# Patient Record
Sex: Male | Born: 1960 | Race: White | Hispanic: No | Marital: Single | Smoking: Never smoker
Health system: Southern US, Community
[De-identification: ages and names within clinical notes are randomized; demographics above are authoritative.]

## PROBLEM LIST (undated history)

## (undated) HISTORY — PX: MITRAL VALVE SURGERY: SHX714

---

## 2020-03-09 ENCOUNTER — Emergency Department (HOSPITAL_COMMUNITY): Payer: BC Managed Care – PPO

## 2020-03-09 ENCOUNTER — Encounter (HOSPITAL_COMMUNITY): Payer: Self-pay | Admitting: Emergency Medicine

## 2020-03-09 ENCOUNTER — Emergency Department (HOSPITAL_COMMUNITY)
Admission: EM | Admit: 2020-03-09 | Discharge: 2020-03-09 | Disposition: A | Discharge status: Home / Self Care | Payer: BC Managed Care – PPO | Attending: Emergency Medicine | Admitting: Emergency Medicine

## 2020-03-09 ENCOUNTER — Telehealth (HOSPITAL_COMMUNITY): Payer: Self-pay

## 2020-03-09 ENCOUNTER — Other Ambulatory Visit: Payer: Self-pay

## 2020-03-09 DIAGNOSIS — R0789 Other chest pain: Secondary | ICD-10-CM | POA: Insufficient documentation

## 2020-03-09 DIAGNOSIS — Z952 Presence of prosthetic heart valve: Secondary | ICD-10-CM | POA: Insufficient documentation

## 2020-03-09 LAB — CBC
HCT: 42 % (ref 39.0–52.0)
Hemoglobin: 14.1 g/dL (ref 13.0–17.0)
MCH: 29.4 pg (ref 26.0–34.0)
MCHC: 33.6 g/dL (ref 30.0–36.0)
MCV: 87.7 fL (ref 80.0–100.0)
Platelets: 185 10*3/uL (ref 150–400)
RBC: 4.79 MIL/uL (ref 4.22–5.81)
RDW: 12.1 % (ref 11.5–15.5)
WBC: 9.3 10*3/uL (ref 4.0–10.5)
nRBC: 0 % (ref 0.0–0.2)

## 2020-03-09 LAB — BASIC METABOLIC PANEL
Anion gap: 8 (ref 5–15)
BUN: 21 mg/dL — ABNORMAL HIGH (ref 6–20)
CO2: 27 mmol/L (ref 22–32)
Calcium: 9.2 mg/dL (ref 8.9–10.3)
Chloride: 102 mmol/L (ref 98–111)
Creatinine, Ser: 1.22 mg/dL (ref 0.61–1.24)
GFR calc Af Amer: >60 mL/min (ref >60)
GFR calc non Af Amer: >60 mL/min (ref >60)
Glucose, Bld: 126 mg/dL — ABNORMAL HIGH (ref 70–99)
Potassium: 4 mmol/L (ref 3.5–5.1)
Sodium: 137 mmol/L (ref 135–145)

## 2020-03-09 LAB — TROPONIN I (HIGH SENSITIVITY)
Troponin I (High Sensitivity): <2 ng/L (ref <18)
Troponin I (High Sensitivity): <2 ng/L (ref <18)

## 2020-03-09 MED ORDER — SODIUM CHLORIDE 0.9% FLUSH: 3.0000 mL | Freq: Once | INTRAVENOUS | Status: DC

## 2020-03-09 NOTE — ED Triage Notes (Addendum)
Pt sent from UC for CP. States he woke w/L choulder pain, radiated to central chest, causing pressure w/difficulty taking breaths. Normal EKG per UC, sent for persistent cp. Denies any n/v, belching a lot in triage

## 2020-03-09 NOTE — Telephone Encounter (Signed)
Patient presents to UC for left side chest pain, chest heaviness, mild SOB, shakiness, and a "bad feeling." Patient reports he has a hx of mitral valva surgery about 5 year ago. STAT ECG completed and showed to Moshe Cipro, NP and Dr. Mardella Layman, MD who agrees patient requires further work up than what UC can offer. Discussed with patient, who agreed to plan of care.   Report called to [facility] charge RN, Verlon Au. Vitals are as follows:   BP: 123/83 HR: 65 RR: 16 O2: 100% Temp: 97.30F

## 2020-03-09 NOTE — ED Triage Notes (Signed)
Hx of mitral valve surgery 2015

## 2020-03-09 NOTE — Discharge Instructions (Addendum)
Start taking pepcid, available over the counter twice daily for the next week.  Get rechecked immediately if you develop any new or concerning symptoms.

## 2020-03-09 NOTE — ED Provider Notes (Signed)
[facility] EMERGENCY DEPARTMENT Provider Note   CSN: [id number] Arrival date & time: 03/09/20  3220     History Chief Complaint  Patient presents with  . Chest Pain    Ian Peterson is a 59 y.o. male.  The history is provided by the patient and medical records. No language interpreter was used.  Chest Pain  Ian Peterson is a 59 y.o. male who presents to the Emergency Department complaining of chest pain. He presents the emergency department complaining of left sided in central chest pain. Pain is described as a pressure sensation. Initially started in his left shoulder and was worse with movement and then later resolved in his shoulder and stayed in his central chest. He does have some slight increased discomfort with deep breaths. He has been belching, which seems to improve his symptoms. He denies any fevers, cough, nausea, vomiting, diaphoresis, abdominal pain, leg swelling or pain. 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 No personal or family history of DVT or coronary artery disease. He does not use tobacco or drugs.    History reviewed. No pertinent past medical history.  There are no problems to display for this patient.   Past Surgical History:  Procedure Laterality Date  . MITRAL VALVE SURGERY         No family history on file.  Social History   Tobacco Use  . Smoking status: Never Smoker  . Smokeless tobacco: Never Used  Substance Use Topics  . Alcohol use: Yes    Comment: occasional  . Drug use: Never    Home Medications Prior to Admission medications   Not on File    Allergies    Patient has no allergy information on record.  Review of Systems   Review of Systems  Cardiovascular: Positive for chest pain.  All other systems reviewed and are negative.   Physical Exam Updated Vital Signs BP 116/80 (BP Location: Right Arm)   Pulse 67   Temp 97.6 F (36.4 C) (Oral)   Resp 16   Wt  73.5 kg   SpO2 100%   Physical Exam Vitals and nursing note reviewed.  Constitutional:      Appearance: He is well-developed.  HENT:     Head: Normocephalic and atraumatic.  Cardiovascular:     Rate and Rhythm: Normal rate and regular rhythm.     Heart sounds: No murmur.  Pulmonary:     Effort: Pulmonary effort is normal. No respiratory distress.     Breath sounds: Normal breath sounds.  Abdominal:     Palpations: Abdomen is soft.     Tenderness: There is no abdominal tenderness. There is no guarding or rebound.  Musculoskeletal:        General: No swelling or tenderness.  Skin:    General: Skin is warm and dry.  Neurological:     Mental Status: He is alert and oriented to person, place, and time.  Psychiatric:        Behavior: Behavior normal.     ED Results / Procedures / Treatments   Labs (all labs ordered are listed, but only abnormal results are displayed) Labs Reviewed  BASIC METABOLIC PANEL - Abnormal; Notable for the following components:      Result Value   Glucose, Bld 126 (*)    BUN 21 (*)    All other components within normal limits  CBC  TROPONIN I (HIGH SENSITIVITY)  TROPONIN I (HIGH SENSITIVITY)    EKG EKG Interpretation  Date/Time:  Monday March 09 2020 08:37:34 EDT Ventricular Rate:  67 PR Interval:  182 QRS Duration: 80 QT Interval:  380 QTC Calculation: 401 R Axis:     Text Interpretation: Normal sinus rhythm Normal ECG Confirmed by Pattricia Boss [phone]) on 03/09/2020 12:30:07 PM   Radiology DG Chest 2 View  Result Date: 03/09/2020 CLINICAL DATA:  Chest pain EXAM: CHEST - 2 VIEW COMPARISON:  None. FINDINGS: Lungs are clear. Heart size and pulmonary vascularity are normal. No adenopathy. Patient is status post mitral valve replacement. No pneumothorax. No bone lesions. IMPRESSION: Status post mitral valve replacement. Lungs clear. Cardiac silhouette within normal limits. Electronically Signed   By: Lowella Grip III M.D.   On: 03/09/2020  09:00    Procedures Procedures (including critical care time)  Medications Ordered in ED Medications  sodium chloride flush (NS) 0.9 % injection 3 mL (has no administration in time range)    ED Course  I have reviewed the triage vital signs and the nursing notes.  Pertinent labs & imaging results that were available during my care of the patient were reviewed by me and considered in my medical decision making (see chart for details).    MDM Rules/Calculators/A&P                     Patient here for evaluation of chest discomfort, shoulder discomfort since this morning. EKG without acute ischemic changes and troponin is negative times two. He is low risk for acute cardiac event. Presentation is not consistent with ACS, PE, dissection. Discussed with patient unclear source of symptoms, question possible reflux contributing to his symptoms with the element of belching. Discussed home care, outpatient follow-up and return precautions.  Final Clinical Impression(s) / ED Diagnoses Final diagnoses:  Atypical chest pain    Rx / DC Orders ED Discharge Orders    None       Quintella Reichert, MD 03/09/20 1451

## 2021-04-18 IMAGING — CR DG CHEST 2V
2 series · 2 of 2 positions shown · non-contrast
Comparison: None.

CLINICAL DATA: Chest pain

EXAM:
CHEST - 2 VIEW

[chest pa]
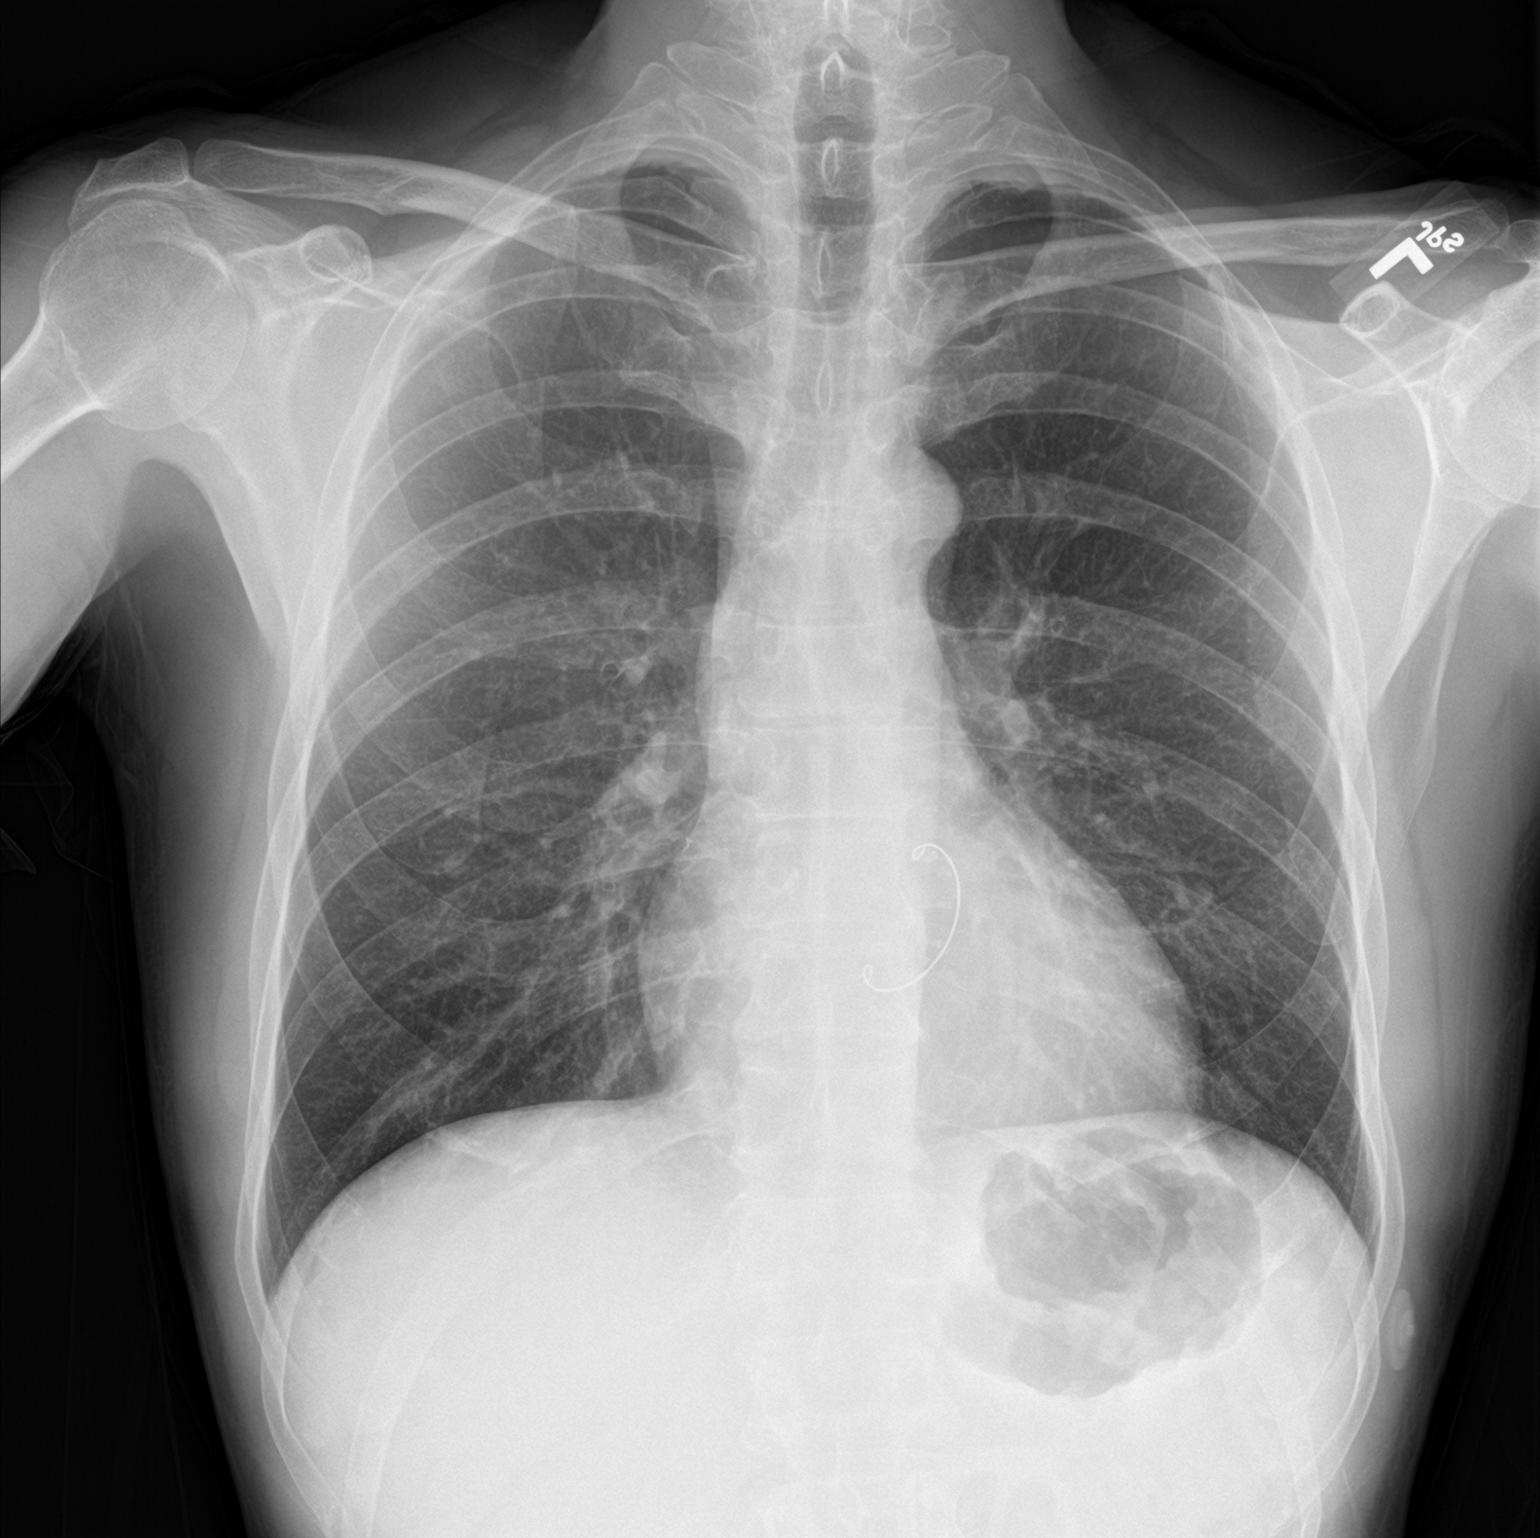

[chest lat]
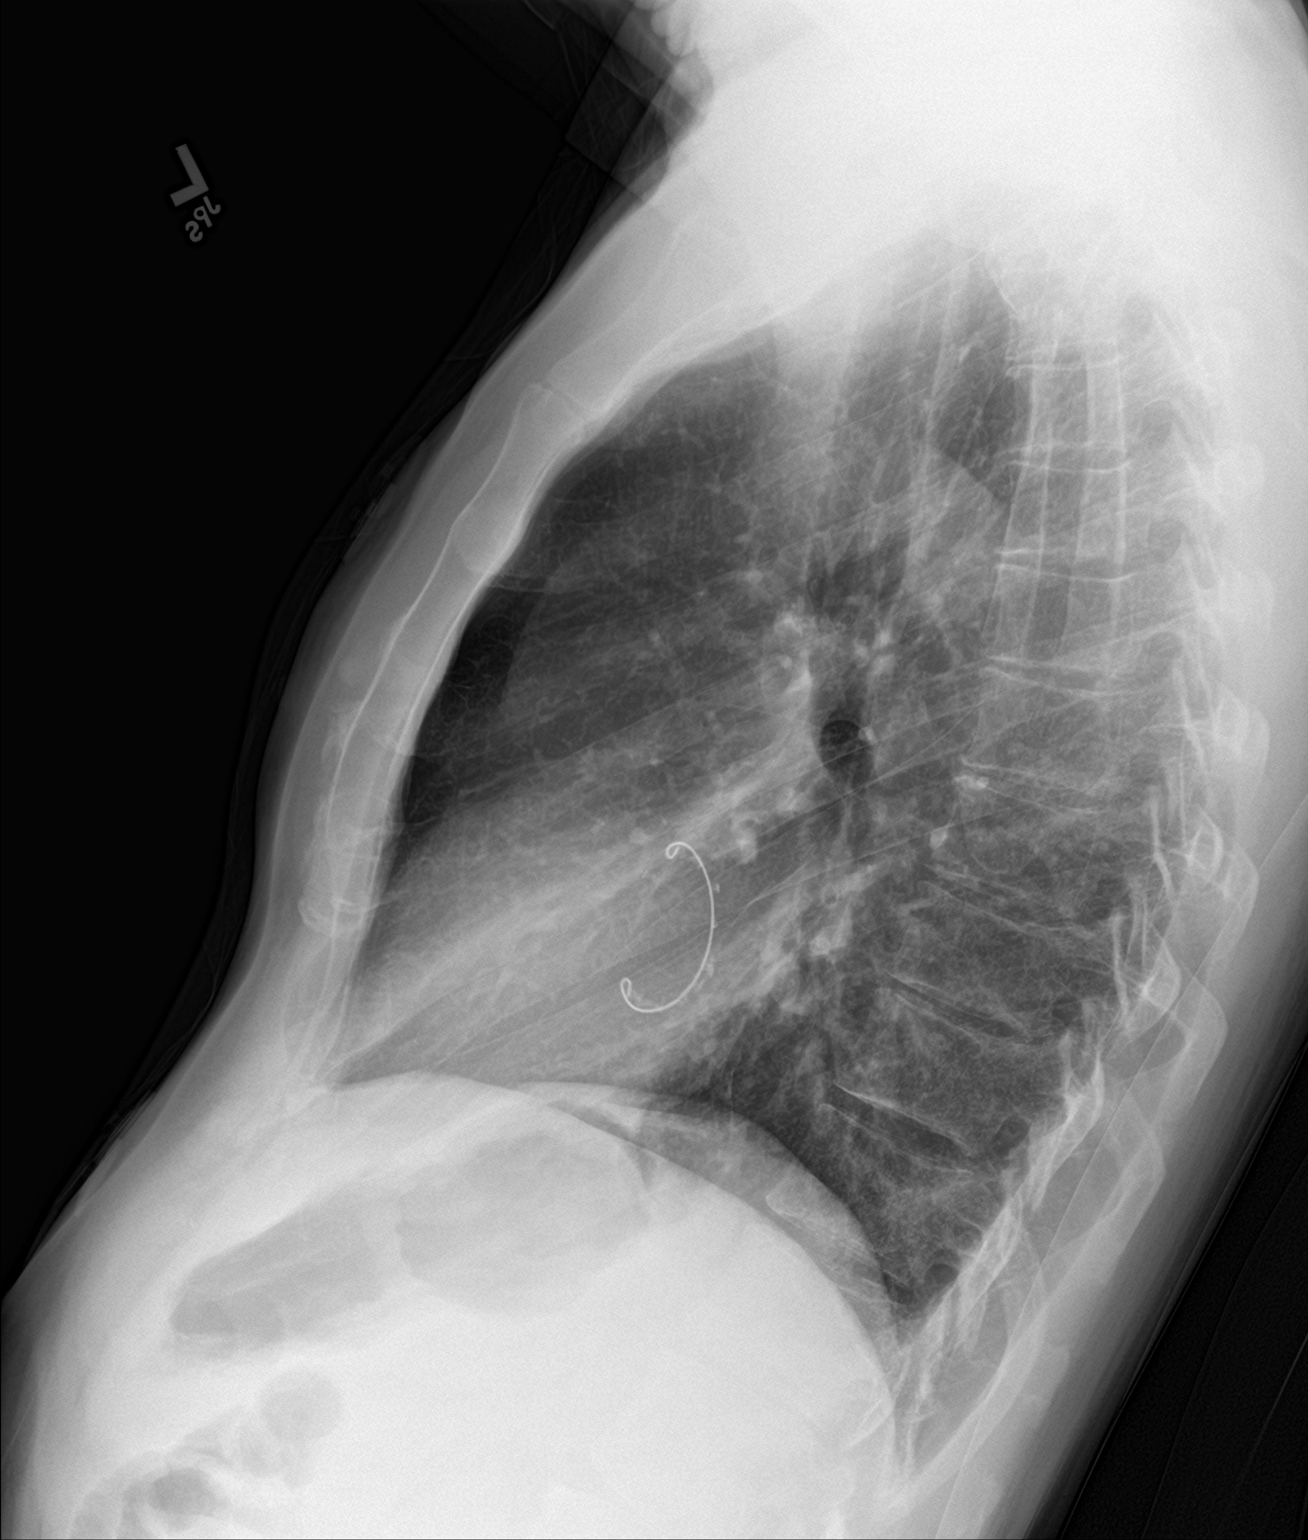

[2 of 2 positions shown; findings below may reference images not displayed]

FINDINGS: Lungs are clear. Heart size and pulmonary vascularity are normal. No
adenopathy. Patient is status post mitral valve replacement. No
pneumothorax. No bone lesions.
IMPRESSION: Status post mitral valve replacement. Lungs clear. Cardiac
silhouette within normal limits.
